# Patient Record
Sex: Female | Born: 1985 | Race: Black or African American | Hispanic: No | Marital: Single | Smoking: Never smoker
Health system: Southern US, Community
[De-identification: ages and names within clinical notes are randomized; demographics above are authoritative.]

## PROBLEM LIST (undated history)

## (undated) DIAGNOSIS — I10 Essential (primary) hypertension: Secondary | ICD-10-CM

---
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
## 2007-01-10 ENCOUNTER — Emergency Department: Payer: Self-pay | Admitting: Emergency Medicine

## 2007-08-08 ENCOUNTER — Emergency Department: Payer: Self-pay | Admitting: Emergency Medicine

## 2007-11-20 ENCOUNTER — Emergency Department: Payer: Self-pay | Admitting: Emergency Medicine

## 2008-04-23 ENCOUNTER — Emergency Department: Payer: Self-pay | Admitting: Emergency Medicine

## 2008-06-29 ENCOUNTER — Emergency Department: Payer: Self-pay | Admitting: Emergency Medicine

## 2008-08-18 ENCOUNTER — Emergency Department: Payer: Self-pay | Admitting: Emergency Medicine

## 2008-09-04 ENCOUNTER — Emergency Department: Payer: Self-pay | Admitting: Internal Medicine

## 2010-01-12 ENCOUNTER — Emergency Department: Payer: Self-pay | Admitting: Emergency Medicine

## 2011-06-07 ENCOUNTER — Emergency Department: Payer: Self-pay | Admitting: Emergency Medicine

## 2011-06-27 ENCOUNTER — Emergency Department: Payer: Self-pay | Admitting: *Deleted

## 2013-12-31 ENCOUNTER — Emergency Department: Payer: Self-pay | Admitting: Emergency Medicine

## 2014-01-03 LAB — BETA STREP CULTURE(ARMC)

## 2014-05-25 ENCOUNTER — Emergency Department: Payer: Self-pay | Admitting: Emergency Medicine

## 2014-05-25 LAB — URINALYSIS, COMPLETE
Bacteria: NONE SEEN
Bilirubin,UR: NEGATIVE
Blood: NEGATIVE
Glucose,UR: NEGATIVE mg/dL (ref 0–75)
Ketone: NEGATIVE
Nitrite: NEGATIVE
PH: 6 (ref 4.5–8.0)
Protein: NEGATIVE
RBC,UR: 2 /HPF (ref 0–5)
SPECIFIC GRAVITY: 1.02 (ref 1.003–1.030)
Squamous Epithelial: 1

## 2014-05-25 LAB — GC/CHLAMYDIA PROBE AMP

## 2014-05-25 LAB — WET PREP, GENITAL

## 2015-02-15 ENCOUNTER — Encounter (HOSPITAL_COMMUNITY): Payer: Self-pay | Admitting: Emergency Medicine

## 2015-02-15 ENCOUNTER — Emergency Department (HOSPITAL_COMMUNITY)
Admission: EM | Admit: 2015-02-15 | Discharge: 2015-02-15 | Disposition: A | Discharge status: Home / Self Care | Payer: Self-pay | Attending: Emergency Medicine | Admitting: Emergency Medicine

## 2015-02-15 ENCOUNTER — Emergency Department (HOSPITAL_COMMUNITY): Payer: Self-pay

## 2015-02-15 DIAGNOSIS — S81011A Laceration without foreign body, right knee, initial encounter: Secondary | ICD-10-CM | POA: Insufficient documentation

## 2015-02-15 DIAGNOSIS — S81012A Laceration without foreign body, left knee, initial encounter: Secondary | ICD-10-CM | POA: Insufficient documentation

## 2015-02-15 DIAGNOSIS — S79911A Unspecified injury of right hip, initial encounter: Secondary | ICD-10-CM | POA: Insufficient documentation

## 2015-02-15 DIAGNOSIS — Y9389 Activity, other specified: Secondary | ICD-10-CM | POA: Insufficient documentation

## 2015-02-15 DIAGNOSIS — Y998 Other external cause status: Secondary | ICD-10-CM | POA: Insufficient documentation

## 2015-02-15 DIAGNOSIS — S6991XA Unspecified injury of right wrist, hand and finger(s), initial encounter: Secondary | ICD-10-CM | POA: Insufficient documentation

## 2015-02-15 DIAGNOSIS — Z3202 Encounter for pregnancy test, result negative: Secondary | ICD-10-CM | POA: Insufficient documentation

## 2015-02-15 DIAGNOSIS — T148XXA Other injury of unspecified body region, initial encounter: Secondary | ICD-10-CM

## 2015-02-15 DIAGNOSIS — S59901A Unspecified injury of right elbow, initial encounter: Secondary | ICD-10-CM | POA: Insufficient documentation

## 2015-02-15 DIAGNOSIS — S6992XA Unspecified injury of left wrist, hand and finger(s), initial encounter: Secondary | ICD-10-CM | POA: Insufficient documentation

## 2015-02-15 DIAGNOSIS — Y92410 Unspecified street and highway as the place of occurrence of the external cause: Secondary | ICD-10-CM | POA: Insufficient documentation

## 2015-02-15 DIAGNOSIS — S30816A Abrasion of unspecified external genital organs, female, initial encounter: Secondary | ICD-10-CM | POA: Insufficient documentation

## 2015-02-15 LAB — I-STAT BETA HCG BLOOD, ED (MC, WL, AP ONLY)

## 2015-02-15 MED ORDER — FENTANYL CITRATE (PF) 100 MCG/2ML IJ SOLN
50.0000 ug | Freq: Once | INTRAMUSCULAR | Status: AC
  Administered 2015-02-15: 50 ug via INTRAVENOUS
  Filled 2015-02-15: qty 2

## 2015-02-15 MED ORDER — SILVER SULFADIAZINE 1 % EX CREA: 1.0000 "application " | TOPICAL_CREAM | Freq: Every day | CUTANEOUS | Status: DC

## 2015-02-15 MED ORDER — SILVER SULFADIAZINE 1 % EX CREA
TOPICAL_CREAM | Freq: Once | CUTANEOUS | Status: AC
  Administered 2015-02-15: 22:00:00 via TOPICAL
  Filled 2015-02-15: qty 85

## 2015-02-15 MED ORDER — ONDANSETRON HCL 4 MG/2ML IJ SOLN
4.0000 mg | Freq: Once | INTRAMUSCULAR | Status: AC
  Administered 2015-02-15: 4 mg via INTRAVENOUS
  Filled 2015-02-15: qty 2

## 2015-02-15 MED ORDER — HYDROCODONE-ACETAMINOPHEN 5-325 MG PO TABS: 1.0000 | ORAL_TABLET | Freq: Four times a day (QID) | ORAL | Status: DC | PRN

## 2015-02-15 MED ORDER — HYDROMORPHONE HCL 1 MG/ML IJ SOLN
0.5000 mg | Freq: Once | INTRAMUSCULAR | Status: AC
  Administered 2015-02-15: 0.5 mg via INTRAVENOUS
  Filled 2015-02-15: qty 1

## 2015-02-15 NOTE — Discharge Instructions (Signed)
Take ibuprofen for pain. Norco for severe pain only, do not drive if taking this medicine. Apply Silvadene cream to the abrasions, keep them wrapped with nonstick dressing. Change daily. Follow-up with the wound center for recheck or with primary care doctor.  Abrasion An abrasion is a cut or scrape of the skin. Abrasions do not extend through all layers of the skin and most heal within 10 days. It is important to care for your abrasion properly to prevent infection. CAUSES  Most abrasions are caused by falling on, or gliding across, the ground or other surface. When your skin rubs on something, the outer and inner layer of skin rubs off, causing an abrasion. DIAGNOSIS  Your caregiver will be able to diagnose an abrasion during a physical exam.  TREATMENT  Your treatment depends on how large and deep the abrasion is. Generally, your abrasion will be cleaned with water and a mild soap to remove any dirt or debris. An antibiotic ointment may be put over the abrasion to prevent an infection. A bandage (dressing) may be wrapped around the abrasion to keep it from getting dirty.  You may need a tetanus shot if:  You cannot remember when you had your last tetanus shot.  You have never had a tetanus shot.  The injury broke your skin. If you get a tetanus shot, your arm may swell, get red, and feel warm to the touch. This is common and not a problem. If you need a tetanus shot and you choose not to have one, there is a rare chance of getting tetanus. Sickness from tetanus can be serious.  HOME CARE INSTRUCTIONS   If a dressing was applied, change it at least once a day or as directed by your caregiver. If the bandage sticks, soak it off with warm water.   Wash the area with water and a mild soap to remove all the ointment 2 times a day. Rinse off the soap and pat the area dry with a clean towel.   Reapply any ointment as directed by your caregiver. This will help prevent infection and keep the  bandage from sticking. Use gauze over the wound and under the dressing to help keep the bandage from sticking.   Change your dressing right away if it becomes wet or dirty.   Only take over-the-counter or prescription medicines for pain, discomfort, or fever as directed by your caregiver.   Follow up with your caregiver within 24-48 hours for a wound check, or as directed. If you were not given a wound-check appointment, look closely at your abrasion for redness, swelling, or pus. These are signs of infection. SEEK IMMEDIATE MEDICAL CARE IF:   You have increasing pain in the wound.   You have redness, swelling, or tenderness around the wound.   You have pus coming from the wound.   You have a fever or persistent symptoms for more than 2-3 days.  You have a fever and your symptoms suddenly get worse.  You have a bad smell coming from the wound or dressing.  MAKE SURE YOU:   Understand these instructions.  Will watch your condition.  Will get help right away if you are not doing well or get worse. Document Released: 06/02/2005 Document Revised: 08/09/2012 Document Reviewed: 07/27/2011 [facility] Patient Information 2015 [place], [place]. This information is not intended to replace advice given to you by your health care provider. Make sure you discuss any questions you have with your health care provider.
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
## 2015-02-15 NOTE — ED Notes (Signed)
Patient transported to X-ray 
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
## 2016-11-26 ENCOUNTER — Ambulatory Visit (INDEPENDENT_AMBULATORY_CARE_PROVIDER_SITE_OTHER): Payer: Self-pay | Admitting: Obstetrics and Gynecology

## 2016-11-26 ENCOUNTER — Encounter: Payer: Self-pay | Admitting: Obstetrics and Gynecology

## 2016-11-26 VITALS — BP 114/70 | Ht 59.0 in | Wt 122.0 lb

## 2016-11-26 DIAGNOSIS — N979 Female infertility, unspecified: Secondary | ICD-10-CM | POA: Insufficient documentation

## 2016-11-26 NOTE — Progress Notes (Signed)
Gynecology H&P  PCP: No PCP Per Patient   Chief Complaint  Patient presents with  . Infertility   History of Present Illness: Patient is a 31 y.o. G1P0010 presenting for evaluation of infertility. Patient and partner have been attempting conception for 2 years. Marital Status: engaged for 3 years. Pregnancies with current partner no Has had a pelvic ultrasound that patient states was normal.    Workup at [facility] was negative: Labs performed and negative were as follows: Gonorrhea, chlamydia, trichomonas, CBC, quant hCG, Hgb electrophoresis, HIV, prolactin, TSH, Varicella, Rubella Blood type: A pos Pap smear: NILM  Sexual History Frequency: a few times per month(s) Satisfied: yes Dyspareunia: no Use of Lubricant: no Douching: no  Ovulatory Evaluation LMP: Patient's last menstrual period was 10/19/2016. Menarche:13 Duration of flow: 4 days Heavy Menses: no Clots: no Molimina yes Ovulation Predictor Kits Positive  yes Intermenstrual Bleeding: occasional spotting Dysmenorrhea: no Amenorrhea: not applicable Wt Change: no Hirsutism: no Balding: no Acne: no Galactorrhea: occasional (once every 6 months) Hot Flashes: no Meds: none Other Therapies: Not applicable  Tubal Factor Previous abdominal or pelvic surgery: none Pelvic Pain:  no Endometriosis: no STD: yes, trichomonas, treated at time of EAB (past [place] statutes, went to [place] to have induced AB) PID: no Laparoscopy: no Prior HSG: no  Female Factor Sired prior conception:  no Semen analysis: no  Contraception Combined OCPs, Depo Provera  Contributing Habits Cigarettes:    Patient -  former, quit 3 months ago    Fiance (Brandon) - yes, 1 packs per week for 12 years Alcohol:    Patient-  occasional mixed drinks    Husband - occasional mixed drinks Marijuana: Patient quit using 4 months (3 time per weeks), Apolinar Junes - daily  Review of Systems: Review of Systems  Constitutional: Negative.   HENT: Negative.     Eyes: Negative.   Respiratory: Negative.   Cardiovascular: Negative.   Gastrointestinal: Negative.   Genitourinary: Negative.   Musculoskeletal: Negative.   Skin: Negative.   Neurological: Negative.   Psychiatric/Behavioral: Negative.     Past Medical History:  History reviewed. No pertinent past medical history.   Past Surgical History:  Procedure Laterality Date  . INDUCED ABORTION  2006    Obstetric History: G2P0110  Induced abortion at >20 weeks at age 57 SAB possibly February this year. Was given misoprostol.  She had no menses for > 2 months and never had a positive pregnancy test and had negative blood work.   Family History:  Thyroid Problems: no Heart Condition or High Blood Pressure: high blood pressure (mother, MGM, MGF) Blood Clot or Stroke: MGF (stroke) Diabetes: MGGM Cancer: no Birth Defects/Inherited diseases:no Other Medical Problems: no MR/autism/fragile X or POF: no   Social History   Social History  . Marital status: Single    Spouse name: N/A  . Number of children: N/A  . Years of education: N/A   Occupational History  . Not on file.   Social History Main Topics  . Smoking status: Never Smoker  . Smokeless tobacco: Never Used  . Alcohol use Yes  . Drug use: No  . Sexual activity: Yes    Birth control/ protection: None   Other Topics Concern  . Not on file   Social History Narrative  . No narrative on file    Allergies: No Known Allergies  Medications: None    Physical Exam Vitals: Blood pressure 114/70, height 4\' 11"  (1.499 m), weight 122 lb (55.3 kg), last menstrual  period 10/19/2016. Physical Exam  Constitutional: She is oriented to person, place, and time. She appears well-developed and well-nourished. No distress.  Genitourinary: Vagina normal and uterus normal. Pelvic exam was performed with patient supine. There is no rash, tenderness, lesion or injury on the right labia. There is no rash, tenderness, lesion or injury  on the left labia. Right adnexum does not display mass, does not display tenderness and does not display fullness. Left adnexum does not display mass, does not display tenderness and does not display fullness. Cervix does not exhibit motion tenderness, lesion, discharge or polyp.   Uterus is mobile and anteverted. Uterus is not enlarged, tender or exhibiting a mass.  Genitourinary Comments: Uterus deviated to patient's left  HENT:  Head: Normocephalic and atraumatic.  Eyes: Conjunctivae and EOM are normal. No scleral icterus.  Neck: Normal range of motion. Neck supple. No tracheal deviation present. No thyromegaly present.  Cardiovascular: Normal rate and regular rhythm.  Exam reveals no gallop and no friction rub.   No murmur heard. Pulmonary/Chest: Effort normal and breath sounds normal. No respiratory distress. She has no wheezes. She has no rales.  Abdominal: Soft. Bowel sounds are normal. She exhibits no distension and no mass. There is no tenderness. There is no guarding. No hernia.  Musculoskeletal: Normal range of motion. She exhibits no edema or deformity.  Lymphadenopathy:    She has no cervical adenopathy.  Neurological: She is alert and oriented to person, place, and time. No cranial nerve deficit.  Skin: Skin is warm and dry. No rash noted.  Psychiatric: She has a normal mood and affect. Her behavior is normal. Judgment normal.    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   Plan: 1) We discussed the underlying etiologies which may be implicated in a couple experiencing difficulty conceiving.  The average couple will conceive within the span of 1 year with unprotected coitus, with a monthly fecundity rate of 20% or 1 in 5.  Even without further work up or intervention the patient and her partner may be successful in conceiving unassisted, although if an underlying etiology can be identified and addressed fecundity rate may improve.  The work up entails examining for ovulatory  function, tubal patency, and ruling out female factor infertility.  These may be looked at concurrently or sequentially.  The downside of sequential work up is that this method may miss issues if more than one compartment is contributing.  She is aware that tubal factor or moderate to severe female factor infertility will require further consultation with a reproductive endocrinologist.  In the case of anovulation, use of Clomid (clomiphen citrate) or Femara (letrazole) were discussed with the understanding the the later is an off-label, but well supported use.  With either of these drugs the risk of multiples increases from the standard population rate of 2% to approximately 10%, with higher order multiples possible but unlikely.  Both drugs may require some time to titrate to the appropriate dosage to ensure consistent ovulation.  Cycles will be limited to 6 cycles on each drug secondary to decreasing rates of conception after 6 cycles.  In addition should patient be started on ovulation induction with Clomid she was advised to discontinue the drug for any vision changes as this is a rare but potentially permanent side-effect if medication is continued.  We discussed timing of intercourse as well as the use of ovulation predictor kits identify the patient's fertile window each month.     2) Preconception counseling: she is  instructed to start taking PNVs and folic acid. Immunization up to date.  The patient denies any family history of conditions which would warrant preconception genetic counseling or testing on her or her partner.  Instructed to start prenatal vitamins while trying to conceive.     3) Will begin with day -21 progesterone level, which would be 4/2, based on LMP of 3/13.  Will schedule HSG once her next menses begins. Will also obtain a semen analysis for her fiance.    30 minutes spent in face to face discussion with > 50% spent in counseling and management of her infertility.   Thomasene MohairStephen  Jackson, MD 11/26/2016 1:36 PM

## 2016-12-06 ENCOUNTER — Other Ambulatory Visit: Payer: Medicaid Other

## 2016-12-10 ENCOUNTER — Other Ambulatory Visit: Payer: Medicaid Other

## 2016-12-10 ENCOUNTER — Ambulatory Visit: Payer: Medicaid Other | Admitting: Obstetrics and Gynecology

## 2016-12-13 ENCOUNTER — Other Ambulatory Visit: Payer: Medicaid Other

## 2017-01-05 ENCOUNTER — Other Ambulatory Visit: Payer: Medicaid Other

## 2017-01-05 ENCOUNTER — Ambulatory Visit: Payer: Medicaid Other | Admitting: Obstetrics and Gynecology

## 2018-10-29 ENCOUNTER — Emergency Department
Admission: EM | Admit: 2018-10-29 | Discharge: 2018-10-29 | Disposition: A | Discharge status: Home / Self Care | Payer: Self-pay | Attending: Emergency Medicine | Admitting: Emergency Medicine

## 2018-10-29 DIAGNOSIS — H6982 Other specified disorders of Eustachian tube, left ear: Secondary | ICD-10-CM | POA: Insufficient documentation

## 2018-10-29 MED ORDER — FLUTICASONE PROPIONATE 50 MCG/ACT NA SUSP: 2.0000 | Freq: Every day | NASAL | 0 refills | Status: DC

## 2018-10-29 MED ORDER — MECLIZINE HCL 25 MG PO TABS
12.5000 mg | ORAL_TABLET | Freq: Once | ORAL | Status: AC
  Administered 2018-10-29: 12.5 mg via ORAL
  Filled 2018-10-29: qty 1

## 2018-10-29 MED ORDER — MECLIZINE HCL 12.5 MG PO TABS: 12.5000 mg | ORAL_TABLET | Freq: Three times a day (TID) | ORAL | 0 refills | Status: DC | PRN

## 2018-10-29 NOTE — ED Provider Notes (Signed)
[facility] Emergency Department Provider Note ____________________________________________  Time seen: Approximately 11:13 PM  I have reviewed the triage vital signs and the nursing notes.   HISTORY  Chief Complaint Otalgia    HPI Carly Rodriguez is a 33 y.o. female resents to the emergency department for treatment and evaluation of left ear pain and decreased ability to hear.  She had pain in ear for a couple of days then today noticed that she could not hear very well.  She states that she thought maybe it was wax and used some earwax drops.  She states that wind makes the pain worse.  She has felt a little bit of off balance, but states that she has a history of vertigo.   History reviewed. No pertinent past medical history.  Patient Active Problem List   Diagnosis Date Noted  . Infertility, female 11/26/2016    Past Surgical History:  Procedure Laterality Date  . INDUCED ABORTION  2006    Prior to Admission medications   Not on File    Allergies Patient has no known allergies.  No family history on file.  Social History Social History   Tobacco Use  . Smoking status: Never Smoker  . Smokeless tobacco: Never Used  Substance Use Topics  . Alcohol use: Yes  . Drug use: No    Review of Systems Constitutional: Negative for fever. Positive for decreased ability to hear from left ear(s). Eyes: Negative for discharge or drainage. ENT:       Positive for otalgia in left ear(s).      Negative for rhinorrhea or congestion.      Negative for sore throat. Gastrointestinal: Negative for nausea, vomiting, or diarrhea. Musculoskeletal: Negative for myalgias. Skin: Negative for rash, lesions, or wounds. Neurological: Negative for paresthesias. ____________________________________________   PHYSICAL EXAM:  VITAL SIGNS: ED Triage Vitals  Enc Vitals Group     BP 10/29/18 2207 (!) 136/98     Pulse Rate 10/29/18 2207 90     Resp 10/29/18  2207 16     Temp 10/29/18 2207 98.5 F (36.9 C)     Temp Source 10/29/18 2207 Oral     SpO2 10/29/18 2207 100 %     Weight 10/29/18 2207 120 lb (54.4 kg)     Height 10/29/18 2207 4\' 11"  (1.499 m)     Head Circumference --      Peak Flow --      Pain Score 10/29/18 2211 8     Pain Loc --      Pain Edu? --      Excl. in GC? --     Constitutional: Well appearing. Eyes: Conjunctivae are clear without discharge or drainage. Ears:       Right TM: Normal.      Left TM: Retracted with air-fluid levels. Head: Atraumatic. Nose: No rhinorrhea or sinus pain on percussion. Mouth/Throat: Oropharynx normal. Tonsils  without exudate. Hematological/Lymphatic/Immunilogical: No palpable anterior cervical lymphadenopathy. Cardiovascular: Heart rate and rhythm are regular without murmur, gallop, or rub appreciated. Respiratory: Breath sounds are clear throughout to auscultation.  Neurologic:  Alert and oriented x 4. Skin: Intact and without rash, lesion, or wound on exposed skin surfaces. ____________________________________________   LABS (all labs ordered are listed, but only abnormal results are displayed)  Labs Reviewed - No data to display ____________________________________________   RADIOLOGY  Not indicated ____________________________________________   PROCEDURES  Procedure(s) performed:   Procedures  ____________________________________________   INITIAL IMPRESSION / ASSESSMENT AND  PLAN / ED COURSE  33 year old female presents to the emergency department for decreased ability to hear out of the left ear as well as otalgia.  She states that she had felt like she had an ear infection for a few days, but today noticed that the pain in the ear had gotten worse and she had decreased ability to hear.  She has not noticed any drainage or discharge from the ear.  Exam shows a retracted eardrum with air-fluid levels.  She states that she had missed a couple doses of her Zyrtec, but  has since restarted taking that daily.  She complains that her vertigo has gotten worse with the pain in her ear.  She will be treated with Antivert and fluticasone nasal spray.  She was encouraged to continue taking her antihistamine every day.  She is to follow-up with the ear nose and throat specialist for symptoms are not improving over the week.  She was encouraged to return to the emergency department for symptoms of change or worsen if she is unable to schedule appointment.  Pertinent labs & imaging results that were available during my care of the patient were reviewed by me and considered in my medical decision making (see chart for details). ____________________________________________   FINAL CLINICAL IMPRESSION(S) / ED DIAGNOSES  Final diagnoses:  None    ED Discharge Orders    None      If controlled substance prescribed during this visit, 12 month history viewed on the [facility] prior to issuing an initial prescription for Schedule II or III opiod.   Note:  This document was prepared using Dragon voice recognition software and may include unintentional dictation errors.     Chinita Pester, FNP 10/29/18 2329    Sharman Cheek, MD 11/03/18 [phone]

## 2018-10-29 NOTE — Discharge Instructions (Signed)
Follow-up with the ear nose and throat specialist for symptoms that are not improving with these medications.  Continue taking your daily allergy medication.  Return to the emergency department for symptoms that change or worsen if you are unable to schedule an appointment with your primary care provider or the specialist.

## 2018-10-29 NOTE — ED Triage Notes (Signed)
Patient presents with possible left ear infection. Was sore for a day or two. Today had a feeling of not hearing too well out of it, feeling of being off balance, and slight swelling under the ear itself.

## 2019-08-13 ENCOUNTER — Other Ambulatory Visit: Payer: Self-pay

## 2019-08-13 DIAGNOSIS — Z20822 Contact with and (suspected) exposure to covid-19: Secondary | ICD-10-CM

## 2019-08-15 ENCOUNTER — Ambulatory Visit: Payer: Self-pay

## 2019-08-15 LAB — NOVEL CORONAVIRUS, NAA: SARS-CoV-2, NAA: DETECTED — AB

## 2019-08-15 NOTE — Telephone Encounter (Signed)
Provided-19 test results .  Voiced  Understanding.    Denies  Any Sx.  Encouraged Patient to call back with questions,  Voiced understanding.Reviewed isolation Protocol

## 2020-01-18 ENCOUNTER — Emergency Department
Admission: EM | Admit: 2020-01-18 | Discharge: 2020-01-18 | Disposition: A | Discharge status: Home / Self Care | Payer: Self-pay | Attending: Emergency Medicine | Admitting: Emergency Medicine

## 2020-01-18 ENCOUNTER — Other Ambulatory Visit: Payer: Self-pay

## 2020-01-18 ENCOUNTER — Encounter: Payer: Self-pay | Admitting: Emergency Medicine

## 2020-01-18 DIAGNOSIS — N12 Tubulo-interstitial nephritis, not specified as acute or chronic: Secondary | ICD-10-CM

## 2020-01-18 DIAGNOSIS — N3000 Acute cystitis without hematuria: Secondary | ICD-10-CM

## 2020-01-18 DIAGNOSIS — Z79899 Other long term (current) drug therapy: Secondary | ICD-10-CM | POA: Insufficient documentation

## 2020-01-18 DIAGNOSIS — R350 Frequency of micturition: Secondary | ICD-10-CM | POA: Insufficient documentation

## 2020-01-18 DIAGNOSIS — R5383 Other fatigue: Secondary | ICD-10-CM | POA: Insufficient documentation

## 2020-01-18 DIAGNOSIS — R3915 Urgency of urination: Secondary | ICD-10-CM | POA: Insufficient documentation

## 2020-01-18 DIAGNOSIS — R509 Fever, unspecified: Secondary | ICD-10-CM | POA: Insufficient documentation

## 2020-01-18 LAB — CBC WITH DIFFERENTIAL/PLATELET
Abs Immature Granulocytes: 0.05 10*3/uL (ref 0.00–0.07)
Basophils Absolute: 0.1 10*3/uL (ref 0.0–0.1)
Basophils Relative: 0 %
Eosinophils Absolute: 0.2 10*3/uL (ref 0.0–0.5)
Eosinophils Relative: 1 %
HCT: 36.4 % (ref 36.0–46.0)
Hemoglobin: 12.4 g/dL (ref 12.0–15.0)
Immature Granulocytes: 0 %
Lymphocytes Relative: 15 %
Lymphs Abs: 2.1 10*3/uL (ref 0.7–4.0)
MCH: 31.2 pg (ref 26.0–34.0)
MCHC: 34.1 g/dL (ref 30.0–36.0)
MCV: 91.7 fL (ref 80.0–100.0)
Monocytes Absolute: 1.3 10*3/uL — ABNORMAL HIGH (ref 0.1–1.0)
Monocytes Relative: 9 %
Neutro Abs: 10.2 10*3/uL — ABNORMAL HIGH (ref 1.7–7.7)
Neutrophils Relative %: 75 %
Platelets: 280 10*3/uL (ref 150–400)
RBC: 3.97 MIL/uL (ref 3.87–5.11)
RDW: 12.9 % (ref 11.5–15.5)
WBC: 13.9 10*3/uL — ABNORMAL HIGH (ref 4.0–10.5)
nRBC: 0 % (ref 0.0–0.2)

## 2020-01-18 LAB — URINALYSIS, COMPLETE (UACMP) WITH MICROSCOPIC
Bilirubin Urine: NEGATIVE
Glucose, UA: NEGATIVE mg/dL
Hgb urine dipstick: NEGATIVE
Ketones, ur: 20 mg/dL — AB
Nitrite: POSITIVE — AB
Protein, ur: NEGATIVE mg/dL
Specific Gravity, Urine: 1.015 (ref 1.005–1.030)
WBC, UA: >50 WBC/hpf — ABNORMAL HIGH (ref 0–5)
pH: 6 (ref 5.0–8.0)

## 2020-01-18 LAB — COMPREHENSIVE METABOLIC PANEL
ALT: 15 U/L (ref 0–44)
AST: 15 U/L (ref 15–41)
Albumin: 3.6 g/dL (ref 3.5–5.0)
Alkaline Phosphatase: 47 U/L (ref 38–126)
Anion gap: 8 (ref 5–15)
BUN: 6 mg/dL (ref 6–20)
CO2: 25 mmol/L (ref 22–32)
Calcium: 8.6 mg/dL — ABNORMAL LOW (ref 8.9–10.3)
Chloride: 103 mmol/L (ref 98–111)
Creatinine, Ser: 0.75 mg/dL (ref 0.44–1.00)
GFR calc Af Amer: >60 mL/min (ref >60)
GFR calc non Af Amer: >60 mL/min (ref >60)
Glucose, Bld: 117 mg/dL — ABNORMAL HIGH (ref 70–99)
Potassium: 3.7 mmol/L (ref 3.5–5.1)
Sodium: 136 mmol/L (ref 135–145)
Total Bilirubin: 0.8 mg/dL (ref 0.3–1.2)
Total Protein: 7.6 g/dL (ref 6.5–8.1)

## 2020-01-18 LAB — LACTIC ACID, PLASMA: Lactic Acid, Venous: 0.8 mmol/L (ref 0.5–1.9)

## 2020-01-18 LAB — POCT PREGNANCY, URINE: Preg Test, Ur: NEGATIVE

## 2020-01-18 MED ORDER — SODIUM CHLORIDE 0.9% FLUSH: 3.0000 mL | Freq: Once | INTRAVENOUS | Status: DC

## 2020-01-18 MED ORDER — CIPROFLOXACIN HCL 500 MG PO TABS: 500.0000 mg | ORAL_TABLET | Freq: Two times a day (BID) | ORAL | 0 refills | Status: DC

## 2020-01-18 MED ORDER — ONDANSETRON 4 MG PO TBDP: 4.0000 mg | ORAL_TABLET | Freq: Three times a day (TID) | ORAL | 0 refills | Status: DC | PRN

## 2020-01-18 NOTE — ED Triage Notes (Signed)
Pt presents to ED via POV with c/o L flank pain that started on Monday, pt states started drinking cranberry juice, had intermittent fevers, took a covid test that was negative. Pt states strong urine smell at home. Pt states took Tylenol last night at approx 2300.

## 2020-01-18 NOTE — ED Provider Notes (Signed)
[facility] Emergency Department Provider Note  ____________________________________________  Time seen: Approximately 9:54 AM  I have reviewed the triage vital signs and the nursing notes.   HISTORY  Chief Complaint Dysuria    HPI Carly Rodriguez is a 34 y.o. female with no significant past medical history who comes ED complaining of fatigue, urinary frequency and urgency for the past month, now worsened with left flank pain fevers chills and strong smelling urine odor for the past 4 days.  Symptoms are waxing and waning, no aggravating or alleviating factors.  No vomiting chest pain shortness of breath.  Flank pain is nonradiating.      History reviewed. No pertinent past medical history.   Patient Active Problem List   Diagnosis Date Noted  . Infertility, female 11/26/2016     Past Surgical History:  Procedure Laterality Date  . INDUCED ABORTION  2006     Prior to Admission medications   Medication Sig Start Date End Date Taking? Authorizing Provider  ciprofloxacin (CIPRO) 500 MG tablet Take 1 tablet (500 mg total) by mouth 2 (two) times daily. 01/18/20   Carrie Mew, MD  fluticasone [facility]) 50 MCG/ACT nasal spray Place 2 sprays into both nostrils daily. 10/29/18   Triplett, Johnette Abraham B, FNP  meclizine (ANTIVERT) 12.5 MG tablet Take 1 tablet (12.5 mg total) by mouth 3 (three) times daily as needed for dizziness. 10/29/18   Triplett, Johnette Abraham B, FNP  ondansetron (ZOFRAN ODT) 4 MG disintegrating tablet Take 1 tablet (4 mg total) by mouth every 8 (eight) hours as needed for nausea or vomiting. 01/18/20   Carrie Mew, MD     Allergies Patient has no known allergies.   History reviewed. No pertinent family history.  Social History Social History   Tobacco Use  . Smoking status: Never Smoker  . Smokeless tobacco: Never Used  Substance Use Topics  . Alcohol use: Yes  . Drug use: No    Review of Systems  Constitutional: Positive  fever and chills.  ENT:   No sore throat. No rhinorrhea. Cardiovascular:   No chest pain or syncope. Respiratory:   No dyspnea or cough. Gastrointestinal:   Negative for abdominal pain, vomiting and diarrhea.  Musculoskeletal:     Positive flank pain All other systems reviewed and are negative except as documented above in ROS and HPI.  ____________________________________________   PHYSICAL EXAM:  VITAL SIGNS: ED Triage Vitals [01/18/20 0821]  Enc Vitals Group     BP (!) 152/91     Pulse Rate (!) 104     Resp 18     Temp 100 F (37.8 C)     Temp Source Oral     SpO2 100 %     Weight 115 lb (52.2 kg)     Height 4\' 11"  (1.499 m)     Head Circumference      Peak Flow      Pain Score 8     Pain Loc      Pain Edu?      Excl. in [place]?     Vital signs reviewed, nursing assessments reviewed.   Constitutional:   Alert and oriented. Non-toxic appearance. Eyes:   Conjunctivae are normal. EOMI. PERRL. ENT      Head:   Normocephalic and atraumatic.      Nose:   Wearing a mask.      Mouth/Throat:   Wearing a mask.      Neck:   No meningismus. Full ROM.  Cardiovascular:   RRR. Symmetric bilateral radial and DP pulses.  No murmurs. Cap refill less than 2 seconds. Respiratory:   Normal respiratory effort without tachypnea/retractions. Breath sounds are clear and equal bilaterally. No wheezes/rales/rhonchi. Gastrointestinal:   Soft with left lower quadrant tenderness. Non distended. There is mild left-sided CVA tenderness.  No rebound, rigidity, or guarding.  Musculoskeletal:   Normal range of motion in all extremities. No joint effusions.  No lower extremity tenderness.  No edema. Neurologic:   Normal speech and language.  Motor grossly intact. No acute focal neurologic deficits are appreciated.  Skin:    Skin is warm, dry and intact. No rash noted.  No petechiae, purpura, or bullae.  ____________________________________________    LABS (pertinent positives/negatives) (all  labs ordered are listed, but only abnormal results are displayed) Labs Reviewed  COMPREHENSIVE METABOLIC PANEL - Abnormal; Notable for the following components:      Result Value   Glucose, Bld 117 (*)    Calcium 8.6 (*)    All other components within normal limits  CBC WITH DIFFERENTIAL/PLATELET - Abnormal; Notable for the following components:   WBC 13.9 (*)    Neutro Abs 10.2 (*)    Monocytes Absolute 1.3 (*)    All other components within normal limits  URINALYSIS, COMPLETE (UACMP) WITH MICROSCOPIC - Abnormal; Notable for the following components:   Color, Urine YELLOW (*)    APPearance CLOUDY (*)    Ketones, ur 20 (*)    Nitrite POSITIVE (*)    Leukocytes,Ua SMALL (*)    WBC, UA >50 (*)    Bacteria, UA MANY (*)    All other components within normal limits  LACTIC ACID, PLASMA  POC URINE PREG, ED  POCT PREGNANCY, URINE   ____________________________________________   EKG    ____________________________________________    RADIOLOGY  No results found.  ____________________________________________   PROCEDURES Procedures  ____________________________________________    CLINICAL IMPRESSION / ASSESSMENT AND PLAN / ED COURSE  Medications ordered in the ED: Medications  sodium chloride flush (NS) 0.9 % injection 3 mL (3 mLs Intravenous Not Given 01/18/20 5284)    Pertinent labs & imaging results that were available during my care of the patient were reviewed by me and considered in my medical decision making (see chart for details).  Carly Rodriguez was evaluated in Emergency Department on 01/18/2020 for the symptoms described in the history of present illness. She was evaluated in the context of the global COVID-19 pandemic, which necessitated consideration that the patient might be at risk for infection with the SARS-CoV-2 virus that causes COVID-19. Institutional protocols and algorithms that pertain to the evaluation of patients at risk for COVID-19 are in  a state of rapid change based on information released by regulatory bodies including the CDC and federal and state organizations. These policies and algorithms were followed during the patient's care in the ED.   Patient presents with urinary symptoms and constitutional symptoms.  Exam is consistent with cystitis and pyelonephritis.  She has slight tachycardia, but she is nontoxic and not septic on assessment. Considering the patient's symptoms, medical history, and physical examination today, I have low suspicion for cholecystitis or biliary pathology, pancreatitis, perforation or bowel obstruction, hernia, intra-abdominal abscess, AAA or dissection, volvulus or intussusception, mesenteric ischemia, or appendicitis.  Doubt STI PID TOA or torsion.  Labs are reassuring other than urinalysis which is consistent with UTI.  Also the urine culture, start her on ciprofloxacin, Zofran as needed.  ____________________________________________   FINAL CLINICAL IMPRESSION(S) / ED DIAGNOSES    Final diagnoses:  Pyelonephritis of left kidney  Acute cystitis without hematuria     ED Discharge Orders         Ordered    ondansetron (ZOFRAN ODT) 4 MG disintegrating tablet  Every 8 hours PRN     01/18/20 0954    ciprofloxacin (CIPRO) 500 MG tablet  2 times daily     01/18/20 0954          Portions of this note were generated with dragon dictation software. Dictation errors may occur despite best attempts at proofreading.   Sharman Cheek, MD 01/18/20 [phone]

## 2020-01-20 LAB — URINE CULTURE: Culture: >=100000 — AB

## 2020-02-25 ENCOUNTER — Other Ambulatory Visit: Payer: Self-pay

## 2020-02-25 ENCOUNTER — Emergency Department
Admission: EM | Admit: 2020-02-25 | Discharge: 2020-02-25 | Disposition: A | Discharge status: Home / Self Care | Payer: HRSA Program | Attending: Emergency Medicine | Admitting: Emergency Medicine

## 2020-02-25 DIAGNOSIS — Z20822 Contact with and (suspected) exposure to covid-19: Secondary | ICD-10-CM | POA: Insufficient documentation

## 2020-02-25 DIAGNOSIS — J00 Acute nasopharyngitis [common cold]: Secondary | ICD-10-CM | POA: Insufficient documentation

## 2020-02-25 DIAGNOSIS — R0981 Nasal congestion: Secondary | ICD-10-CM | POA: Diagnosis present

## 2020-02-25 LAB — SARS CORONAVIRUS 2 BY RT PCR (HOSPITAL ORDER, PERFORMED IN ~~LOC~~ HOSPITAL LAB): SARS Coronavirus 2: NEGATIVE

## 2020-02-25 NOTE — ED Provider Notes (Signed)
[facility] Emergency Department Provider Note  ____________________________________________   First MD Initiated Contact with Patient 02/25/20 1016     (approximate)  I have reviewed the triage vital signs and the nursing notes.   HISTORY  Chief Complaint URI    HPI Carly Rodriguez is a 34 y.o. female presents to the emergency department complaining of URI symptoms.  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  She denies any fever or chills.  Patient previously had Covid in December 2020.  She did not receive a vaccine.  She denies any chest pain or shortness of breath.  States she feels like is just her sinuses.    History reviewed. No pertinent past medical history.  Patient Active Problem List   Diagnosis Date Noted  . Infertility, female 11/26/2016    Past Surgical History:  Procedure Laterality Date  . INDUCED ABORTION  2006    Prior to Admission medications   Not on File    Allergies Patient has no known allergies.  History reviewed. No pertinent family history.  Social History Social History   Tobacco Use  . Smoking status: Never Smoker  . Smokeless tobacco: Never Used  Substance Use Topics  . Alcohol use: Yes  . Drug use: No    Review of Systems  Constitutional: No fever/chills Eyes: No visual changes. ENT: Positive for nasal congestion and sore throat. Respiratory: Denies cough Cardiovascular: Denies chest pain Gastrointestinal: Denies abdominal pain Genitourinary: Negative for dysuria. Musculoskeletal: Negative for back pain. Skin: Negative for rash. Psychiatric: no mood changes,     ____________________________________________   PHYSICAL EXAM:  VITAL SIGNS: ED Triage Vitals  Enc Vitals Group     BP 02/25/20 1000 (!) 132/105     Pulse Rate 02/25/20 1000 (!) 102     Resp 02/25/20 1000 15     Temp 02/25/20 1000 99.2 F (37.3 C)     Temp Source 02/25/20 1000 Oral      SpO2 02/25/20 1000 100 %     Weight 02/25/20 1001 117 lb (53.1 kg)     Height 02/25/20 1001 4\' 11"  (1.499 m)     Head Circumference --      Peak Flow --      Pain Score 02/25/20 1001 6     Pain Loc --      Pain Edu? --      Excl. in [place]? --     Constitutional: Alert and oriented. Well appearing and in no acute distress. Eyes: Conjunctivae are normal.  Head: Atraumatic. Nose: Positive congestion/rhinnorhea. Mouth/Throat: Mucous membranes are moist.  Throat appears normal, no swollen tonsils or exudate are noted Neck:  supple no lymphadenopathy noted Cardiovascular: Normal rate, regular rhythm. Heart sounds are normal Respiratory: Normal respiratory effort.  No retractions, lungs c t a  GU: deferred Musculoskeletal: FROM all extremities, warm and well perfused Neurologic:  Normal speech and language.  Skin:  Skin is warm, dry and intact. No rash noted. Psychiatric: Mood and affect are normal. Speech and behavior are normal.  ____________________________________________   LABS (all labs ordered are listed, but only abnormal results are displayed)  Labs Reviewed  SARS CORONAVIRUS 2 BY RT PCR (HOSPITAL ORDER, PERFORMED IN [facility])   ____________________________________________   ____________________________________________  RADIOLOGY    ____________________________________________   PROCEDURES  Procedure(s) performed: No  Procedures    ____________________________________________   INITIAL IMPRESSION / ASSESSMENT AND PLAN / ED  COURSE  Pertinent labs & imaging results that were available during my care of the patient were reviewed by me and considered in my medical decision making (see chart for details).   Patient is 34 year old female presents emergency department with concerns of URI symptoms.  See HPI.  Physical exam is consistent with a common cold.  However due to the patient working in daycare we will do Covid test.  She was given a  work note stating that she has a pending Covid test and could return if the test is negative.  If positive she will need to quarantine for 10 to 14 days.  She is to use over-the-counter cold medicines.  Mucinex, allergy medications.  Return emergency department worsening.  She is discharged stable condition.  ----------------------------------------- 3:08 PM on 02/25/2020 -----------------------------------------  I did try to inform the patient of her negative test result.  She did not answer her phone and did not return my call.    Carly Rodriguez was evaluated in Emergency Department on 02/25/2020 for the symptoms described in the history of present illness. She was evaluated in the context of the global COVID-19 pandemic, which necessitated consideration that the patient might be at risk for infection with the SARS-CoV-2 virus that causes COVID-19. Institutional protocols and algorithms that pertain to the evaluation of patients at risk for COVID-19 are in a state of rapid change based on information released by regulatory bodies including the CDC and federal and state organizations. These policies and algorithms were followed during the patient's care in the ED.   As part of my medical decision making, I reviewed the following data within the [place] notes reviewed and incorporated, Labs reviewed , Old chart reviewed, Notes from prior ED visits and [place] Controlled Substance Database  ____________________________________________   FINAL CLINICAL IMPRESSION(S) / ED DIAGNOSES  Final diagnoses:  Common cold  Suspected COVID-19 virus infection      NEW MEDICATIONS STARTED DURING THIS VISIT:  New Prescriptions   No medications on file     Note:  This document was prepared using Dragon voice recognition software and may include unintentional dictation errors.    Versie Starks, PA-C 02/25/20 1508    Vanessa Oakhurst, MD 02/26/20 [phone]

## 2020-02-25 NOTE — Discharge Instructions (Signed)
Your Covid test should result in 2 to 3 hours.  We will call you with the result. For your symptoms take over-the-counter Mucinex, over-the-counter allergy medicine, Tylenol or ibuprofen if needed for pain/fever. Return to the emergency department if worsening

## 2020-02-25 NOTE — ED Notes (Signed)
See triage note  Presents with nasal congestion and sore throat  sxs' started yesterday  States she usually takes Zyrtec for her allergies    Took it later yesterday  Was outside  States sxs' became worse during the night  Low grade temp on arrival

## 2020-02-25 NOTE — ED Triage Notes (Signed)
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

## 2020-03-19 ENCOUNTER — Emergency Department
Admission: EM | Admit: 2020-03-19 | Discharge: 2020-03-19 | Disposition: A | Discharge status: Home / Self Care | Payer: Medicaid Other | Attending: Emergency Medicine | Admitting: Emergency Medicine

## 2020-03-19 ENCOUNTER — Encounter: Payer: Self-pay | Admitting: Emergency Medicine

## 2020-03-19 ENCOUNTER — Other Ambulatory Visit: Payer: Self-pay

## 2020-03-19 DIAGNOSIS — H1032 Unspecified acute conjunctivitis, left eye: Secondary | ICD-10-CM | POA: Insufficient documentation

## 2020-03-19 MED ORDER — SULFACETAMIDE SODIUM 10 % OP SOLN: 2.0000 [drp] | Freq: Four times a day (QID) | OPHTHALMIC | 0 refills | Status: AC

## 2020-03-19 NOTE — ED Notes (Signed)
See triage note  Presents with left eye irritation   States her eye has been watery

## 2020-03-19 NOTE — ED Triage Notes (Signed)
Pt reports left eye is itchy, runny and was crusted this am.

## 2020-03-19 NOTE — ED Provider Notes (Signed)
[facility] Emergency Department Provider Note ____________________________________________  Time seen: Approximately 11:26 AM  I have reviewed the triage vital signs and the nursing notes.   HISTORY  Chief Complaint Eye Problem   HPI Carly Rodriguez is a 34 y.o. female presents to the emergency department for treatment and evaluation of left eye irritation, erythema, and drainage.  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  No alleviating measures attempted prior to arrival.  No change in vision.   History reviewed. No pertinent past medical history.  Patient Active Problem List   Diagnosis Date Noted   Infertility, female 11/26/2016    Past Surgical History:  Procedure Laterality Date   INDUCED ABORTION  2006    Prior to Admission medications   Medication Sig Start Date End Date Taking? Authorizing Provider  sulfacetamide (BLEPH-10) 10 % ophthalmic solution Place 2 drops into the right eye 4 (four) times daily for 10 days. 03/19/20 03/29/20  Chinita Pester, FNP    Allergies Patient has no known allergies.  No family history on file.  Social History Social History   Tobacco Use   Smoking status: Never Smoker   Smokeless tobacco: Never Used  Substance Use Topics   Alcohol use: Yes   Drug use: No    Review of Systems   Constitutional: No fever/chills Eyes: Negative for visual changes.  Negative for pain.  Positive for drainage. Musculoskeletal: Negative for pain. Skin: Negative for rash. Neurological: Negative for headaches, focal weakness or numbness. Allergic: Negative for seasonal allergies. ____________________________________________  PHYSICAL EXAM:  VITAL SIGNS: ED Triage Vitals  Enc Vitals Group     BP 03/19/20 1033 (!) 153/97     Pulse Rate 03/19/20 1033 92     Resp 03/19/20  1033 18     Temp 03/19/20 1033 98.2 F (36.8 C)     Temp Source 03/19/20 1033 Oral     SpO2 03/19/20 1033 97 %     Weight 03/19/20 1030 117 lb (53.1 kg)     Height 03/19/20 1030 4\' 11"  (1.499 m)     Head Circumference --      Peak Flow --      Pain Score 03/19/20 1030 0     Pain Loc --      Pain Edu? --      Excl. in [place]? --     Constitutional: Alert and oriented. Well appearing and in no acute distress. Eyes: Visual acuity--see nursing documentation; no globe trauma; Eyelids normal to inspection; Sclera appears anicteric.  Eyelids not inverted. Conjunctiva appears erythematous; Cornea normal appearance on unstained exam. Head: Atraumatic. Nose: No congestion/rhinnorhea. Mouth/Throat: Mucous membranes are moist.  Oropharynx non-erythematous. Respiratory: Respirations even and unlabored. Breath sounds clear to auscultation. Musculoskeletal:Normal ROM x 4 extremities. Neurologic:  Normal speech and language. No gross focal neurologic deficits are appreciated. Speech is normal. No gait instability. Skin:  Skin is warm, dry and intact. No rash noted. Psychiatric: Mood and affect are normal. Speech and behavior are normal.  ____________________________________________   LABS (all labs ordered are listed, but only abnormal results are displayed)  Labs Reviewed - No data to display ____________________________________________  EKG  Not indicated ____________________________________________  RADIOLOGY  Not indicated ____________________________________________   PROCEDURES  Procedure(s) performed: None ____________________________________________   INITIAL IMPRESSION / ASSESSMENT AND PLAN / ED COURSE  34 year old female presenting to the emergency department for symptoms and exam consistent with conjunctivitis.  She will be placed on left 10 ophthalmic solution and given a work excuse for the next 24 hours.  She was encouraged to follow-up with ophthalmology if her  symptoms are not improving over the next few days.  She is to return to the emergency department for symptoms of change or worsen if she is unable to schedule an appointment.  Pertinent labs & imaging results that were available during my care of the patient were reviewed by me and considered in my medical decision making (see chart for details). ____________________________________________   FINAL CLINICAL IMPRESSION(S) / ED DIAGNOSES  Final diagnoses:  Acute bacterial conjunctivitis of left eye    Note:  This document was prepared using Dragon voice recognition software and may include unintentional dictation errors.    Chinita Pester, FNP 03/20/20 1515    Emily Filbert, MD 03/20/20 1517

## 2020-07-01 ENCOUNTER — Emergency Department
Admission: EM | Admit: 2020-07-01 | Discharge: 2020-07-01 | Disposition: A | Discharge status: Home / Self Care | Payer: 59 | Attending: Emergency Medicine | Admitting: Emergency Medicine

## 2020-07-01 ENCOUNTER — Emergency Department: Payer: 59

## 2020-07-01 ENCOUNTER — Other Ambulatory Visit: Payer: Self-pay

## 2020-07-01 ENCOUNTER — Encounter: Payer: Self-pay | Admitting: Emergency Medicine

## 2020-07-01 DIAGNOSIS — R059 Cough, unspecified: Secondary | ICD-10-CM | POA: Diagnosis present

## 2020-07-01 DIAGNOSIS — Z20822 Contact with and (suspected) exposure to covid-19: Secondary | ICD-10-CM | POA: Insufficient documentation

## 2020-07-01 DIAGNOSIS — J069 Acute upper respiratory infection, unspecified: Secondary | ICD-10-CM | POA: Insufficient documentation

## 2020-07-01 LAB — RESPIRATORY PANEL BY RT PCR (FLU A&B, COVID)
Influenza A by PCR: NEGATIVE
Influenza B by PCR: NEGATIVE
SARS Coronavirus 2 by RT PCR: NEGATIVE

## 2020-07-01 MED ORDER — AMOXICILLIN 875 MG PO TABS: 875.0000 mg | ORAL_TABLET | Freq: Two times a day (BID) | ORAL | 0 refills | Status: DC

## 2020-07-01 NOTE — ED Triage Notes (Signed)
Patient to ER for +cough and nasal congestion/discharge since yesterday. Patient reports yellow sputum both with cough and yellow discharge from nose. Possible fever this am.

## 2020-07-01 NOTE — ED Provider Notes (Signed)
[facility] Emergency Department Provider Note  ____________________________________________   First MD Initiated Contact with Patient 07/01/20 1327     (approximate)  I have reviewed the triage vital signs and the nursing notes.   HISTORY  Chief Complaint Cough and Nasal Congestion    HPI Carly Rodriguez is a 34 y.o. female presents emergency department, and cough and congestion with low-grade fever last night.  Patient states she had some yellow sputum with the cough and some yellow nasal drainage.  She denies chest pain or shortness of breath.  Patient had Covid in 2020.  Unsure of any exposures.    History reviewed. No pertinent past medical history.  Patient Active Problem List   Diagnosis Date Noted  . Infertility, female 11/26/2016    Past Surgical History:  Procedure Laterality Date  . INDUCED ABORTION  2006    Prior to Admission medications   Medication Sig Start Date End Date Taking? Authorizing Provider  amoxicillin (AMOXIL) 875 MG tablet Take 1 tablet (875 mg total) by mouth 2 (two) times daily. 07/01/20   Faythe Ghee, PA-C    Allergies Patient has no known allergies.  No family history on file.  Social History Social History   Tobacco Use  . Smoking status: Never Smoker  . Smokeless tobacco: Never Used  Substance Use Topics  . Alcohol use: Yes  . Drug use: No    Review of Systems  Constitutional: Positive fever/chills Eyes: No visual changes. ENT: Positive sore throat. Respiratory: Positive cough Cardiovascular: Denies chest pain Gastrointestinal: Denies abdominal pain Genitourinary: Negative for dysuria. Musculoskeletal: Negative for back pain. Skin: Negative for rash. Psychiatric: no mood changes,     ____________________________________________   PHYSICAL EXAM:  VITAL SIGNS: ED Triage Vitals  Enc Vitals Group     BP 07/01/20 1218 (!) 141/109     Pulse Rate 07/01/20 1218 99     Resp  07/01/20 1218 18     Temp 07/01/20 1218 98.2 F (36.8 C)     Temp Source 07/01/20 1218 Oral     SpO2 07/01/20 1218 99 %     Weight 07/01/20 1212 121 lb (54.9 kg)     Height 07/01/20 1212 4\' 11"  (1.499 m)     Head Circumference --      Peak Flow --      Pain Score 07/01/20 1212 0     Pain Loc --      Pain Edu? --      Excl. in GC? --     Constitutional: Alert and oriented. Well appearing and in no acute distress. Eyes: Conjunctivae are normal.  Head: Atraumatic. Nose: Positive congestion/rhinnorhea. Mouth/Throat: Mucous membranes are moist.  Throat is injected posteriorly Neck:  supple no lymphadenopathy noted Cardiovascular: Normal rate, regular rhythm. Heart sounds are normal Respiratory: Normal respiratory effort.  No retractions, lungs c t a  GU: deferred Musculoskeletal: FROM all extremities, warm and well perfused Neurologic:  Normal speech and language.  Skin:  Skin is warm, dry and intact. No rash noted. Psychiatric: Mood and affect are normal. Speech and behavior are normal.  ____________________________________________   LABS (all labs ordered are listed, but only abnormal results are displayed)  Labs Reviewed  RESPIRATORY PANEL BY RT PCR (FLU A&B, COVID)   ____________________________________________   ____________________________________________  RADIOLOGY  Chest x-ray  ____________________________________________   PROCEDURES  Procedure(s) performed: No  Procedures    ____________________________________________   INITIAL IMPRESSION / ASSESSMENT AND PLAN / ED COURSE  Pertinent labs & imaging results that were available during my care of the patient were reviewed by me and considered in my medical decision making (see chart for details).   Patient is a 34 year old female presents for complaints of cough and congestion.  See HPI.  Physical exam shows patient appears stable.  Exam shows nasal congestion and clear lungs.  Patient will be  tested for Covid and chest x-ray performed   Covid test and chest x-ray which is reviewed by me are normal.  I did explain findings to the patient.  Due to the green mucus I did put her on amoxicillin.  She is to follow-up with her regular doctor if not improving to 3 days.  Return emergency department worsening.  She was given a work note and a copy of her Covid test discharged stable condition.  Carly Rodriguez was evaluated in Emergency Department on 07/01/2020 for the symptoms described in the history of present illness. She was evaluated in the context of the global COVID-19 pandemic, which necessitated consideration that the patient might be at risk for infection with the SARS-CoV-2 virus that causes COVID-19. Institutional protocols and algorithms that pertain to the evaluation of patients at risk for COVID-19 are in a state of rapid change based on information released by regulatory bodies including the CDC and federal and state organizations. These policies and algorithms were followed during the patient's care in the ED.    As part of my medical decision making, I reviewed the following data within the electronic MEDICAL RECORD NUMBER Nursing notes reviewed and incorporated, Labs reviewed , Old chart reviewed, Radiograph reviewed , Notes from prior ED visits and [place] Controlled Substance Database  ____________________________________________   FINAL CLINICAL IMPRESSION(S) / ED DIAGNOSES  Final diagnoses:  Acute upper respiratory infection      NEW MEDICATIONS STARTED DURING THIS VISIT:  Discharge Medication List as of 07/01/2020  2:46 PM       Note:  This document was prepared using Dragon voice recognition software and may include unintentional dictation errors.    Faythe Ghee, PA-C 07/01/20 1544    Dionne Bucy, MD 07/03/20 [phone]

## 2020-07-01 NOTE — Discharge Instructions (Addendum)
Follow-up with your regular doctor if not improving in 2 to 3 days.  Return emergency department worsening.  Your Covid test is negative.  Chest x-ray was normal.  Use medication as prescribed.  Also use over-the-counter allergy medicine and cough medicine.

## 2020-07-30 ENCOUNTER — Ambulatory Visit: Payer: 59 | Admitting: Advanced Practice Midwife

## 2020-09-12 ENCOUNTER — Encounter: Payer: Self-pay | Admitting: Emergency Medicine

## 2020-09-12 ENCOUNTER — Other Ambulatory Visit: Payer: Self-pay

## 2020-09-12 ENCOUNTER — Emergency Department
Admission: EM | Admit: 2020-09-12 | Discharge: 2020-09-12 | Disposition: A | Discharge status: Home / Self Care | Payer: 59 | Attending: Emergency Medicine | Admitting: Emergency Medicine

## 2020-09-12 DIAGNOSIS — R059 Cough, unspecified: Secondary | ICD-10-CM

## 2020-09-12 DIAGNOSIS — U071 COVID-19: Secondary | ICD-10-CM

## 2020-09-12 MED ORDER — BENZONATATE 100 MG PO CAPS: 100.0000 mg | ORAL_CAPSULE | Freq: Three times a day (TID) | ORAL | 0 refills | Status: AC | PRN

## 2020-09-12 NOTE — ED Provider Notes (Signed)
[facility] Emergency Department Provider Note  ____________________________________________   Event Date/Time   First MD Initiated Contact with Patient 09/12/20 1526     (approximate)  I have reviewed the triage vital signs and the nursing notes.   HISTORY  Chief Complaint Cough    HPI Carly Rodriguez is a 35 y.o. female otherwise healthy comes in with cough.  Patient was diagnosed with COVID 2 weeks ago continues to have a cough that is moderate, constant, worse at night, better at rest.  Mild amount of sputum.  She is otherwise denying any fevers, chest pain or shortness of breath.  Denies risk factors for PE          History reviewed. No pertinent past medical history.  Patient Active Problem List   Diagnosis Date Noted  . Infertility, female 11/26/2016    Past Surgical History:  Procedure Laterality Date  . INDUCED ABORTION  2006    Prior to Admission medications   Medication Sig Start Date End Date Taking? Authorizing Provider  amoxicillin (AMOXIL) 875 MG tablet Take 1 tablet (875 mg total) by mouth 2 (two) times daily. 07/01/20   Faythe Ghee, PA-C    Allergies Patient has no known allergies.  No family history on file.  Social History Social History   Tobacco Use  . Smoking status: Never Smoker  . Smokeless tobacco: Never Used  Substance Use Topics  . Alcohol use: Yes  . Drug use: No      Review of Systems Constitutional: No fever/chills Eyes: No visual changes. ENT: No sore throat. Cardiovascular: Denies chest pain. Respiratory: Denies shortness of breath.  Positive cough Gastrointestinal: No abdominal pain.  No nausea, no vomiting.  No diarrhea.  No constipation. Genitourinary: Negative for dysuria. Musculoskeletal: Negative for back pain. Skin: Negative for rash. Neurological: Negative for headaches, focal weakness or numbness. All other ROS  negative ____________________________________________   PHYSICAL EXAM:  VITAL SIGNS: ED Triage Vitals  Enc Vitals Group     BP 09/12/20 1311 (!) 134/112     Pulse Rate 09/12/20 1311 83     Resp 09/12/20 1311 16     Temp 09/12/20 1311 98.1 F (36.7 C)     Temp Source 09/12/20 1403 Oral     SpO2 09/12/20 1311 100 %     Weight 09/12/20 1256 121 lb 0.5 oz (54.9 kg)     Height 09/12/20 1256 4\' 11"  (1.499 m)     Head Circumference --      Peak Flow --      Pain Score 09/12/20 1256 0     Pain Loc --      Pain Edu? --      Excl. in GC? --     Constitutional: Alert and oriented. Well appearing and in no acute distress. Eyes: Conjunctivae are normal. EOMI. Head: Atraumatic. Nose: No congestion/rhinnorhea. Mouth/Throat: Mucous membranes are moist.   Neck: No stridor. Trachea Midline. FROM Cardiovascular: Normal rate, regular rhythm. Grossly normal heart sounds.  Good peripheral circulation. Respiratory: Normal respiratory effort.  No retractions. Lungs CTAB. Gastrointestinal: Soft and nontender. No distention. No abdominal bruits.  Musculoskeletal: No lower extremity tenderness nor edema.  No joint effusions. Neurologic:  Normal speech and language. No gross focal neurologic deficits are appreciated.  Skin:  Skin is warm, dry and intact. No rash noted. Psychiatric: Mood and affect are normal. Speech and behavior are normal. GU: Deferred   ____________________________________________    INITIAL IMPRESSION / ASSESSMENT AND  PLAN / ED COURSE  Carly Rodriguez was evaluated in Emergency Department on 09/12/2020 for the symptoms described in the history of present illness. She was evaluated in the context of the global COVID-19 pandemic, which necessitated consideration that the patient might be at risk for infection with the SARS-CoV-2 virus that causes COVID-19. Institutional protocols and algorithms that pertain to the evaluation of patients at risk for COVID-19 are in a state of  rapid change based on information released by regulatory bodies including the CDC and federal and state organizations. These policies and algorithms were followed during the patient's care in the ED.    Patient is very well-appearing 35 year old who comes in with cough post COVID.  Patient's lungs sound clear to auscultation and she satting at 100% on room air.  Low suspicion for COVID complication for bacterial pneumonia.  No evidence of pneumothorax with listening to her.  Will suspicion for PE.  PERC negative discussed symptomatic treatment  I discussed the provisional nature of ED diagnosis, the treatment so far, the ongoing plan of care, follow up appointments and return precautions with the patient and any family or support people present. They expressed understanding and agreed with the plan, discharged home.    ____________________________________________   FINAL CLINICAL IMPRESSION(S) / ED DIAGNOSES   Final diagnoses:  Cough  COVID-19      MEDICATIONS GIVEN DURING THIS VISIT:  Medications - No data to display   ED Discharge Orders         Ordered    benzonatate (TESSALON PERLES) 100 MG capsule  3 times daily PRN        09/12/20 1538           Note:  This document was prepared using Dragon voice recognition software and may include unintentional dictation errors.   Concha Se, MD 09/12/20 [phone]

## 2020-09-12 NOTE — ED Triage Notes (Signed)
C?O cough.   States tested positive for COVID 2 weeks ago and cough persists.  AAOx3.  Skin warm and dry. No SOB/ DOE NAD

## 2020-09-12 NOTE — Discharge Instructions (Addendum)
Take the Tessalon Perles to help with cough.  Take Flonase over-the-counter to help with postnasal drip.  If you continue to have a significant cough you should not return to work however if your cough is resolving you should be able to return to work on Monday.  Have your blood pressure rechecked in 2 weeks to make sure that is not remaining elevated.

## 2021-09-08 IMAGING — DX DG CHEST 1V PORT
1 series · 1 of 1 positions shown · non-contrast
Comparison: None.

CLINICAL DATA: Cough and congestion

EXAM:
PORTABLE CHEST 1 VIEW

[chest ap]
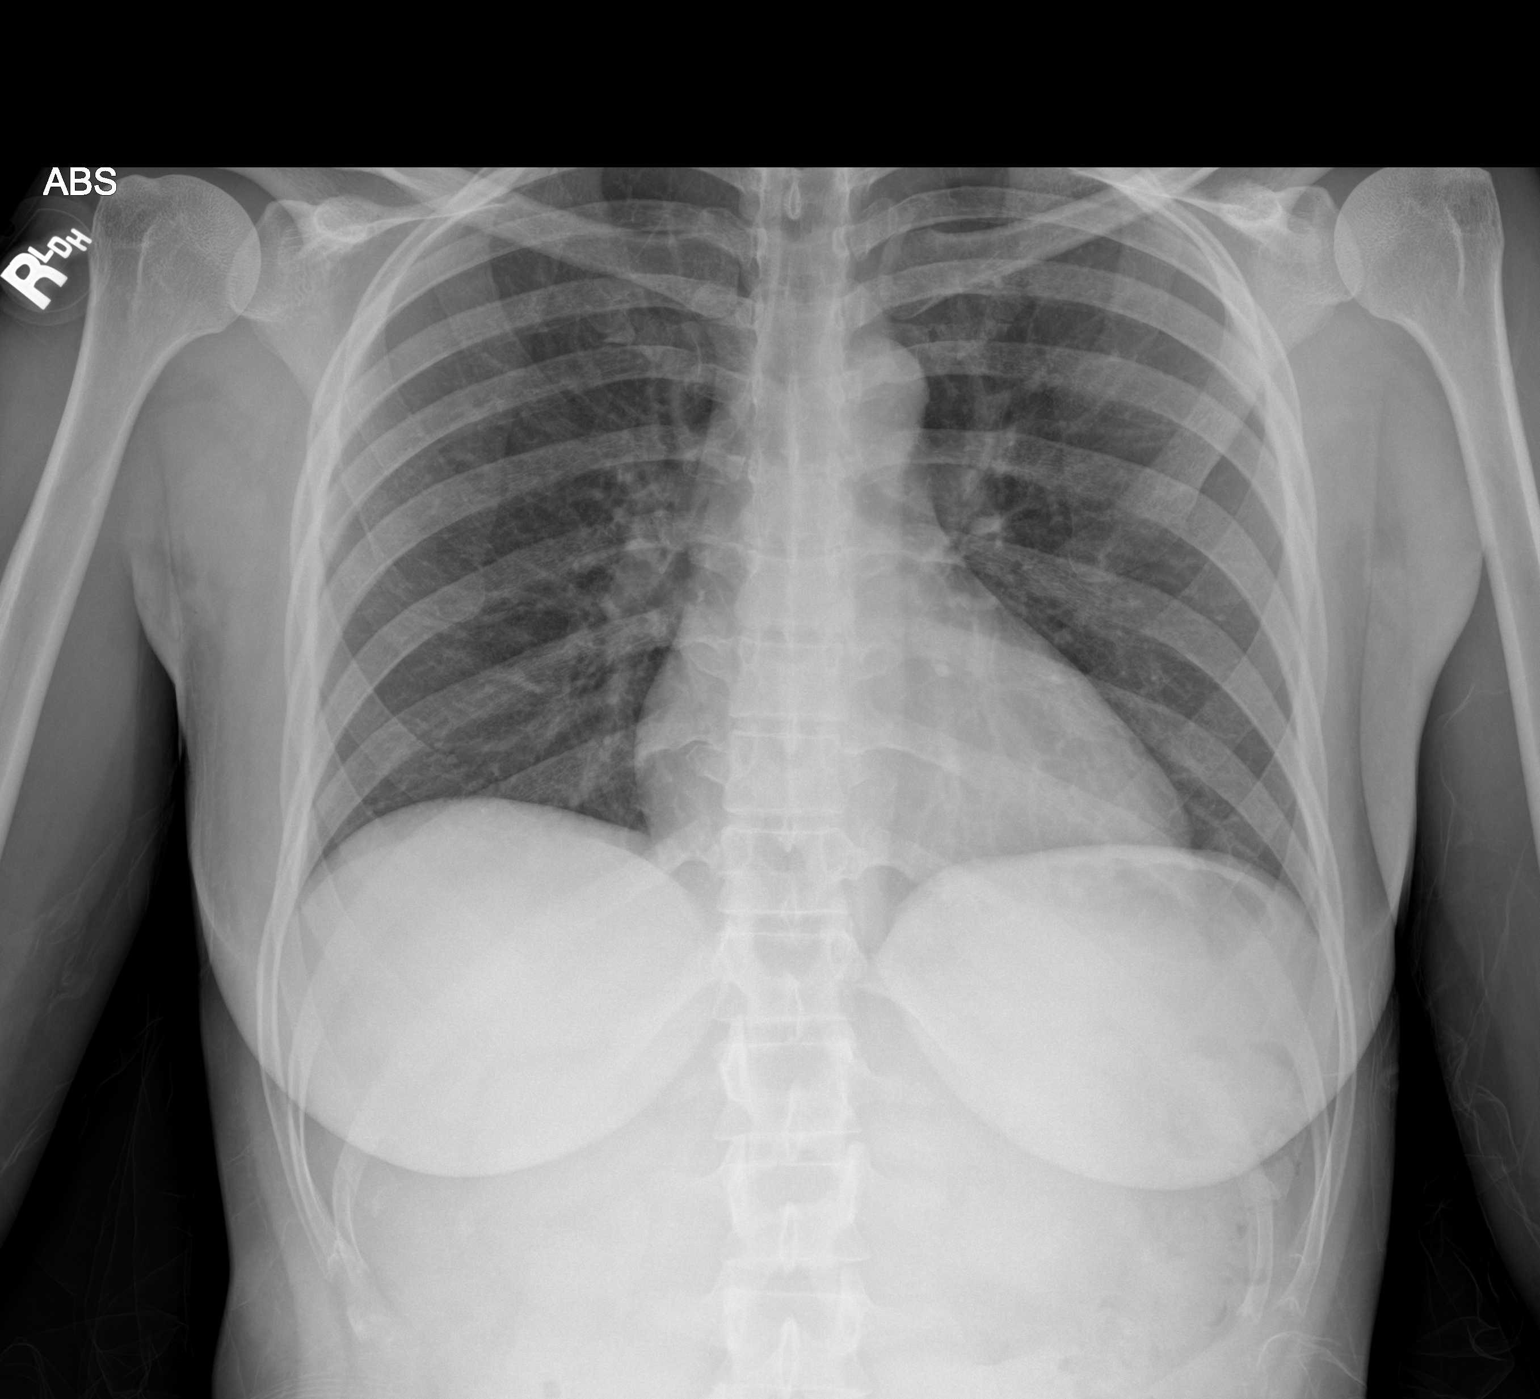

[1 of 1 positions shown; findings below may reference images not displayed]

FINDINGS: The lungs are clear. The heart size and pulmonary vascularity are
normal. No adenopathy. No bone lesions.
IMPRESSION: Lungs clear.  Cardiac silhouette normal.

## 2022-01-08 ENCOUNTER — Emergency Department
Admission: EM | Admit: 2022-01-08 | Discharge: 2022-01-08 | Disposition: A | Discharge status: Home / Self Care | Payer: Self-pay | Attending: Emergency Medicine | Admitting: Emergency Medicine

## 2022-01-08 ENCOUNTER — Encounter: Payer: Self-pay | Admitting: Emergency Medicine

## 2022-01-08 ENCOUNTER — Other Ambulatory Visit: Payer: Self-pay

## 2022-01-08 DIAGNOSIS — H10211 Acute toxic conjunctivitis, right eye: Secondary | ICD-10-CM | POA: Insufficient documentation

## 2022-01-08 DIAGNOSIS — Y990 Civilian activity done for income or pay: Secondary | ICD-10-CM | POA: Insufficient documentation

## 2022-01-08 DIAGNOSIS — T5994XA Toxic effect of unspecified gases, fumes and vapors, undetermined, initial encounter: Secondary | ICD-10-CM | POA: Insufficient documentation

## 2022-01-08 DIAGNOSIS — S0591XA Unspecified injury of right eye and orbit, initial encounter: Secondary | ICD-10-CM | POA: Insufficient documentation

## 2022-01-08 DIAGNOSIS — X58XXXA Exposure to other specified factors, initial encounter: Secondary | ICD-10-CM | POA: Insufficient documentation

## 2022-01-08 MED ORDER — TETRACAINE HCL 0.5 % OP SOLN
2.0000 [drp] | Freq: Once | OPHTHALMIC | Status: AC
  Administered 2022-01-08: 2 [drp] via OPHTHALMIC
  Filled 2022-01-08: qty 4

## 2022-01-08 MED ORDER — ERYTHROMYCIN 5 MG/GM OP OINT: 1.0000 "application " | TOPICAL_OINTMENT | Freq: Four times a day (QID) | OPHTHALMIC | 0 refills | Status: DC

## 2022-01-08 MED ORDER — FLUORESCEIN SODIUM 1 MG OP STRP
1.0000 | ORAL_STRIP | Freq: Once | OPHTHALMIC | Status: AC
Start: 2022-01-08 — End: 2022-01-08
  Administered 2022-01-08: 1 via OPHTHALMIC
  Filled 2022-01-08: qty 1

## 2022-01-08 NOTE — ED Provider Notes (Signed)
? ?[facility] ?Provider Note ? ? ? Event Date/Time  ? First MD Initiated Contact with Patient 01/08/22 1120   ?  (approximate) ? ? ?History  ? ?Chief Complaint ?Eye Injury ? ? ?HPI ? ?Carly Rodriguez is a 36 y.o. female with no significant past medical history presents to the ED complaining of eye injury.  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  She feels like some of the bleach missed got into her right eye and she had immediate onset of burning and irritation.  She states it was initially difficult for her to keep the eye open, however I was irrigated immediately in triage upon arrival.  She now feels like the eye is doing better with her vision back to normal and minimal ongoing irritation.  She does not wear glasses or contacts. ?  ? ? ?Physical Exam  ? ?Triage Vital Signs: ?ED Triage Vitals  ?Enc Vitals Group  ?   BP 01/08/22 1107 (!) 133/104  ?   Pulse Rate 01/08/22 1107 88  ?   Resp 01/08/22 1107 16  ?   Temp 01/08/22 1109 98.2 ?F (36.8 ?C)  ?   Temp src --   ?   SpO2 01/08/22 1107 99 %  ?   Weight 01/08/22 1107 120 lb (54.4 kg)  ?   Height 01/08/22 1107 4\' 11"  (1.499 m)  ?   Head Circumference --   ?   Peak Flow --   ?   Pain Score 01/08/22 1107 4  ?   Pain Loc --   ?   Pain Edu? --   ?   Excl. in GC? --   ? ? ?Most recent vital signs: ?Vitals:  ? 01/08/22 1107 01/08/22 1109  ?BP: (!) 133/104   ?Pulse: 88   ?Resp: 16   ?Temp:  98.2 ?F (36.8 ?C)  ?SpO2: 99%   ? ? ?Constitutional: Alert and oriented. ?Eyes: Pupils equal, round, and reactive to light bilaterally.  Extraocular movements intact.  Mild conjunctival irritation on right with no corneal haziness. ?Head: Atraumatic. ?Nose: No congestion/rhinnorhea. ?Mouth/Throat: Mucous membranes are moist.  ?Cardiovascular: Normal rate, regular rhythm. Grossly normal heart sounds.  2+ radial pulses bilaterally. ?Respiratory: Normal respiratory effort.  No retractions.  Lungs CTAB. ?Gastrointestinal: Soft and nontender. No distention. ?Musculoskeletal: No lower extremity tenderness nor edema.  ?Neurologic:  Normal speech and language. No gross focal neurologic deficits are appreciated. ? ? ? ?ED Results / Procedures / Treatments  ? ?Labs ?(all labs ordered are listed, but only abnormal results are displayed) ?Labs Reviewed - No data to display ? ? ?PROCEDURES: ? ?Critical Care performed: No ? ?Procedures ? ? ?MEDICATIONS ORDERED IN ED: ?Medications  ?tetracaine (PONTOCAINE) 0.5 % ophthalmic solution 2 drop (2 drops Both Eyes Given by Other 01/08/22 1136)  ?fluorescein ophthalmic strip 1 strip (1 strip Both Eyes Given 01/08/22 1136)  ? ? ? ?IMPRESSION / MDM / ASSESSMENT AND PLAN / ED COURSE  ?I reviewed the triage vital signs and the nursing notes. ?             ?               ? ?36 y.o. female with no significant past medical history presents to the ED with right eye irritation after coming into contact with bleach spray she was using for cleaning. ? ?Differential diagnosis includes, but is not limited to,  chemical conjunctivitis, alkali burn, corneal ulceration. ? ?Patient well-appearing and in no acute distress, vital signs are unremarkable and her symptoms seem to be significantly improved following eye irrigation.  Visual acuity is now 20/20 in both eyes and patient with minimal signs of irritation on exam.  pH has now normalized to between 7 and 8 following when checked with pH paper.  She is appropriate for discharge home with close ophthalmology follow-up, will be prescribed erythromycin eye ointment and was counseled to return to the ED for new worsening symptoms.  Patient agrees with plan. ? ?  ? ? ?FINAL CLINICAL IMPRESSION(S) / ED DIAGNOSES  ? ?Final diagnoses:  ?Chemical conjunctivitis of right eye  ? ? ? ?Rx / DC Orders  ? ?ED Discharge Orders   ? ?      Ordered  ?  erythromycin ophthalmic ointment  4 times daily       ? 01/08/22 1200  ? ?  ?  ? ?  ? ? ? ?Note:  This  document was prepared using Dragon voice recognition software and may include unintentional dictation errors. ?  ?Chesley Noon, MD ?01/08/22 1202 ? ?

## 2022-01-08 NOTE — ED Triage Notes (Signed)
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 Pt states that she did not flush ey prior to arrival. Eye was flushed at eye wash station for 15 minutes upon arrival to ED. Pt states that pain in her eye has decreased since flushing it.  ?

## 2022-01-08 NOTE — ED Notes (Signed)
Patients visual acuity for RIGHT and LEFT eye 20/20. ?

## 2022-01-08 NOTE — ED Notes (Signed)
See triage note   presents with right eye irritated   states she got bleach splashed in right eye  states she flushed her eye with water for 20 mins ? ?

## 2022-01-08 NOTE — ED Notes (Signed)
Pt states she will not file workers comp at this time. She will contact supervisor and follow up with them. Bonita Quin, RN made aware. ?

## 2023-01-18 DIAGNOSIS — Z319 Encounter for procreative management, unspecified: Secondary | ICD-10-CM | POA: Diagnosis not present

## 2023-02-02 DIAGNOSIS — Z319 Encounter for procreative management, unspecified: Secondary | ICD-10-CM | POA: Diagnosis not present

## 2023-03-21 ENCOUNTER — Encounter: Payer: Self-pay | Admitting: Emergency Medicine

## 2023-03-21 ENCOUNTER — Other Ambulatory Visit: Payer: Self-pay

## 2023-03-21 ENCOUNTER — Emergency Department
Admission: EM | Admit: 2023-03-21 | Discharge: 2023-03-21 | Disposition: A | Discharge status: Home / Self Care | Payer: 59 | Attending: Emergency Medicine | Admitting: Emergency Medicine

## 2023-03-21 DIAGNOSIS — J209 Acute bronchitis, unspecified: Secondary | ICD-10-CM | POA: Insufficient documentation

## 2023-03-21 DIAGNOSIS — R059 Cough, unspecified: Secondary | ICD-10-CM | POA: Diagnosis not present

## 2023-03-21 DIAGNOSIS — Z1152 Encounter for screening for COVID-19: Secondary | ICD-10-CM | POA: Diagnosis not present

## 2023-03-21 DIAGNOSIS — I10 Essential (primary) hypertension: Secondary | ICD-10-CM | POA: Insufficient documentation

## 2023-03-21 HISTORY — DX: Essential (primary) hypertension: I10

## 2023-03-21 LAB — SARS CORONAVIRUS 2 BY RT PCR: SARS Coronavirus 2 by RT PCR: NEGATIVE

## 2023-03-21 MED ORDER — ALBUTEROL SULFATE HFA 108 (90 BASE) MCG/ACT IN AERS: 2.0000 | INHALATION_SPRAY | Freq: Four times a day (QID) | RESPIRATORY_TRACT | 2 refills | Status: DC | PRN

## 2023-03-21 MED ORDER — PREDNISONE 10 MG (21) PO TBPK: ORAL_TABLET | ORAL | 0 refills | Status: AC

## 2023-03-21 MED ORDER — ALBUTEROL SULFATE HFA 108 (90 BASE) MCG/ACT IN AERS: 2.0000 | INHALATION_SPRAY | Freq: Four times a day (QID) | RESPIRATORY_TRACT | 2 refills | Status: AC | PRN

## 2023-03-21 MED ORDER — PREDNISONE 10 MG (21) PO TBPK: ORAL_TABLET | ORAL | 0 refills | Status: DC

## 2023-03-21 MED ORDER — IPRATROPIUM-ALBUTEROL 0.5-2.5 (3) MG/3ML IN SOLN
3.0000 mL | Freq: Once | RESPIRATORY_TRACT | Status: AC
  Administered 2023-03-21: 3 mL via RESPIRATORY_TRACT
  Filled 2023-03-21: qty 3

## 2023-03-21 NOTE — ED Triage Notes (Signed)
C/O cough, painful cough since yesterday. Also c/o SOB.  Sinus congestion.  Denies chills.  AAOx3.  Skin warm and dry. No SOB/ DOE. NAD

## 2023-03-21 NOTE — ED Provider Notes (Signed)
[facility] Provider Note    Event Date/Time   First MD Initiated Contact with Patient 03/21/23 1243     (approximate)   History   Cough   HPI  Carly Rodriguez is a 37 y.o. female with history of hypertension presents emergency department complaining of cough, some sinus drainage, and some shortness of breath.  Feels like her chest is very tight and she is wheezing.  Denies pain.  Patient is a smoker.  Is not on birth control pills.      Physical Exam   Triage Vital Signs: ED Triage Vitals  Encounter Vitals Group     BP 03/21/23 1204 (!) 178/127     Systolic BP Percentile --      Diastolic BP Percentile --      Pulse Rate 03/21/23 1204 92     Resp 03/21/23 1203 16     Temp 03/21/23 1203 99 F (37.2 C)     Temp Source 03/21/23 1203 Oral     SpO2 03/21/23 1204 97 %     Weight 03/21/23 1202 119 lb 14.9 oz (54.4 kg)     Height 03/21/23 1202 4\' 11"  (1.499 m)     Head Circumference --      Peak Flow --      Pain Score 03/21/23 1201 0     Pain Loc --      Pain Education --      Exclude from Growth Chart --     Most recent vital signs: Vitals:   03/21/23 1203 03/21/23 1204  BP:  (!) 178/127  Pulse:  92  Resp: 16   Temp: 99 F (37.2 C)   SpO2:  97%     General: Awake, no distress.   CV:  Good peripheral perfusion. regular rate and  rhythm Resp:  Normal effort. Lungs with wheezing anteriorly, some posteriorly Abd:  No distention.   Other:      ED Results / Procedures / Treatments   Labs (all labs ordered are listed, but only abnormal results are displayed) Labs Reviewed  SARS CORONAVIRUS 2 BY RT PCR     EKG     RADIOLOGY     PROCEDURES:   Procedures   MEDICATIONS ORDERED IN ED: Medications  ipratropium-albuterol (DUONEB) 0.5-2.5 (3) MG/3ML nebulizer solution 3 mL (3 mLs Nebulization Given 03/21/23 1303)     IMPRESSION / MDM / ASSESSMENT AND PLAN / ED COURSE  I reviewed the triage vital signs and the  nursing notes.                              Differential diagnosis includes, but is not limited to, acute bronchitis, asthma, COVID, URI, CAP  Patient's presentation is most consistent with acute presentation with potential threat to life or bodily function.   Due to the wheezing we will go ahead and give patient a DuoNeb while here in the ED  COVID test is reassuring  Patient was given a DuoNeb and did have relief with this medication.  Did explain all findings to the patient.  Placed her on a albuterol inhaler and steroid pack.  Do not feel that she needs a antibiotic.  Patient is to follow-up with her regular doctor if not improving to 3 days.  Return if worsening.  She is discharged in stable condition.  Was also given a work note      FINAL CLINICAL IMPRESSION(S) /  ED DIAGNOSES   Final diagnoses:  Acute bronchitis, unspecified organism     Rx / DC Orders   ED Discharge Orders          Ordered    predniSONE (STERAPRED UNI-PAK 21 TAB) 10 MG (21) TBPK tablet  Status:  Discontinued        03/21/23 1329    albuterol (VENTOLIN HFA) 108 (90 Base) MCG/ACT inhaler  Every 6 hours PRN,   Status:  Discontinued        03/21/23 1329    albuterol (VENTOLIN HFA) 108 (90 Base) MCG/ACT inhaler  Every 6 hours PRN        03/21/23 1448    predniSONE (STERAPRED UNI-PAK 21 TAB) 10 MG (21) TBPK tablet        03/21/23 1448             Note:  This document was prepared using Dragon voice recognition software and may include unintentional dictation errors.    Faythe Ghee, PA-C 03/21/23 1520    Jene Every, MD 03/21/23 [phone]

## 2023-05-19 DIAGNOSIS — Z1371 Encounter for nonprocreative screening for genetic disease carrier status: Secondary | ICD-10-CM | POA: Diagnosis not present

## 2023-05-19 DIAGNOSIS — D259 Leiomyoma of uterus, unspecified: Secondary | ICD-10-CM | POA: Diagnosis not present

## 2023-05-19 DIAGNOSIS — Z3141 Encounter for fertility testing: Secondary | ICD-10-CM | POA: Diagnosis not present

## 2023-05-19 DIAGNOSIS — Z3169 Encounter for other general counseling and advice on procreation: Secondary | ICD-10-CM | POA: Diagnosis not present

## 2023-05-26 DIAGNOSIS — Z1371 Encounter for nonprocreative screening for genetic disease carrier status: Secondary | ICD-10-CM | POA: Diagnosis not present

## 2023-05-27 DIAGNOSIS — Z1371 Encounter for nonprocreative screening for genetic disease carrier status: Secondary | ICD-10-CM | POA: Diagnosis not present

## 2023-08-21 DIAGNOSIS — J329 Chronic sinusitis, unspecified: Secondary | ICD-10-CM | POA: Diagnosis not present

## 2023-08-21 DIAGNOSIS — I10 Essential (primary) hypertension: Secondary | ICD-10-CM | POA: Diagnosis not present

## 2023-09-21 DIAGNOSIS — L0201 Cutaneous abscess of face: Secondary | ICD-10-CM | POA: Diagnosis not present

## 2023-09-21 DIAGNOSIS — I10 Essential (primary) hypertension: Secondary | ICD-10-CM | POA: Diagnosis not present

## 2023-09-26 ENCOUNTER — Other Ambulatory Visit: Payer: Self-pay

## 2023-09-26 ENCOUNTER — Emergency Department
Admission: EM | Admit: 2023-09-26 | Discharge: 2023-09-26 | Disposition: A | Discharge status: Home / Self Care | Payer: 59 | Attending: Emergency Medicine | Admitting: Emergency Medicine

## 2023-09-26 ENCOUNTER — Encounter: Payer: Self-pay | Admitting: Emergency Medicine

## 2023-09-26 DIAGNOSIS — I10 Essential (primary) hypertension: Secondary | ICD-10-CM | POA: Diagnosis not present

## 2023-09-26 DIAGNOSIS — R519 Headache, unspecified: Secondary | ICD-10-CM | POA: Diagnosis not present

## 2023-09-26 LAB — CBC
HCT: 40 % (ref 36.0–46.0)
Hemoglobin: 12.7 g/dL (ref 12.0–15.0)
MCH: 28.9 pg (ref 26.0–34.0)
MCHC: 31.8 g/dL (ref 30.0–36.0)
MCV: 91.1 fL (ref 80.0–100.0)
Platelets: 412 10*3/uL — ABNORMAL HIGH (ref 150–400)
RBC: 4.39 MIL/uL (ref 3.87–5.11)
RDW: 13.9 % (ref 11.5–15.5)
WBC: 8.6 10*3/uL (ref 4.0–10.5)
nRBC: 0 % (ref 0.0–0.2)

## 2023-09-26 LAB — COMPREHENSIVE METABOLIC PANEL
ALT: 13 U/L (ref 0–44)
AST: 15 U/L (ref 15–41)
Albumin: 3.9 g/dL (ref 3.5–5.0)
Alkaline Phosphatase: 45 U/L (ref 38–126)
Anion gap: 7 (ref 5–15)
BUN: 10 mg/dL (ref 6–20)
CO2: 23 mmol/L (ref 22–32)
Calcium: 8.6 mg/dL — ABNORMAL LOW (ref 8.9–10.3)
Chloride: 107 mmol/L (ref 98–111)
Creatinine, Ser: 0.76 mg/dL (ref 0.44–1.00)
GFR, Estimated: >60 mL/min (ref >60)
Glucose, Bld: 98 mg/dL (ref 70–99)
Potassium: 3.9 mmol/L (ref 3.5–5.1)
Sodium: 137 mmol/L (ref 135–145)
Total Bilirubin: 0.7 mg/dL (ref 0.0–1.2)
Total Protein: 7.4 g/dL (ref 6.5–8.1)

## 2023-09-26 LAB — TROPONIN I (HIGH SENSITIVITY): Troponin I (High Sensitivity): <2 ng/L (ref <18)

## 2023-09-26 LAB — POC URINE PREG, ED: Preg Test, Ur: NEGATIVE

## 2023-09-26 NOTE — ED Triage Notes (Signed)
First nurse note: sent from cardiology for HTN to be admitted. NAD noted

## 2023-09-26 NOTE — ED Triage Notes (Signed)
Pt in via POV, reports being sent over from Cardiology due to hypertension.  Patient reports this has been an ongoing issue since September, prescribed meds but patient states they give her a headache so she has not been taking them routinely.    Patient is asymptomatic, denies any complaints at this time.  Ambulatory to triage, NAD noted at this time.

## 2023-09-26 NOTE — ED Provider Notes (Signed)
[facility] Provider Note    Event Date/Time   First MD Initiated Contact with Patient 09/26/23 1127     (approximate)   History   Hypertension   HPI  Carly Rodriguez is a 38 y.o. female with a history of high blood pressure presents for evaluation of elevated blood pressure.  Review of records demonstrates the patient has had chronically elevated blood pressure with systolics around 09/26/1928.  She is supposed to take labetalol but admits that she takes it infrequently because she thinks it gives her headache.  She was sent in from cardiology for evaluation of her blood pressure.     Physical Exam   Triage Vital Signs: ED Triage Vitals  Encounter Vitals Group     BP 09/26/23 1023 (!) 191/127     Systolic BP Percentile --      Diastolic BP Percentile --      Pulse Rate 09/26/23 1023 85     Resp 09/26/23 1023 15     Temp 09/26/23 1023 98.4 F (36.9 C)     Temp Source 09/26/23 1023 Oral     SpO2 09/26/23 1023 97 %     Weight 09/26/23 1025 66.7 kg (147 lb)     Height 09/26/23 1025 1.499 m (4\' 11" )     Head Circumference --      Peak Flow --      Pain Score 09/26/23 1023 0     Pain Loc --      Pain Education --      Exclude from Growth Chart --     Most recent vital signs: Vitals:   09/26/23 1023 09/26/23 1217  BP: (!) 191/127 (!) 189/116  Pulse: 85 89  Resp: 15 18  Temp: 98.4 F (36.9 C)   SpO2: 97% 98%     General: Awake, no distress.  CV:  Good peripheral perfusion.  Resp:  Normal effort.  Abd:  No distention.  Other:     ED Results / Procedures / Treatments   Labs (all labs ordered are listed, but only abnormal results are displayed) Labs Reviewed  CBC - Abnormal; Notable for the following components:      Result Value   Platelets 412 (*)    All other components within normal limits  COMPREHENSIVE METABOLIC PANEL - Abnormal; Notable for the following components:   Calcium 8.6 (*)    All other components within  normal limits  POC URINE PREG, ED  TROPONIN I (HIGH SENSITIVITY)     EKG     RADIOLOGY     PROCEDURES:  Critical Care performed:  Procedures   MEDICATIONS ORDERED IN ED: Medications - No data to display   IMPRESSION / MDM / ASSESSMENT AND PLAN / ED COURSE  I reviewed the triage vital signs and the nursing notes. Patient's presentation is most consistent with exacerbation of chronic illness.  Patient presents with elevated blood pressure as detailed above, she is asymptomatic.  Review of records demonstrates this is a chronic issue, she does not compliant with her labetalol  Lab work reviewed and is reassuring, pregnancy test negative  Strongly encouraged her to to take her labetalol as prescribed and follow-up closely with cardiology for blood pressure control, no indication for admission at this time        FINAL CLINICAL IMPRESSION(S) / ED DIAGNOSES   Final diagnoses:  Primary hypertension     Rx / DC Orders   ED Discharge Orders  None        Note:  This document was prepared using Dragon voice recognition software and may include unintentional dictation errors.   Jene Every, MD 09/26/23 1359

## 2023-09-26 NOTE — ED Provider Triage Note (Signed)
Emergency Medicine Provider Triage Evaluation Note  SEVERA SCHMAL , a 38 y.o. female  was evaluated in triage.  Pt complains of htn, sent by [facility] for admission, although patient admits to not being consistent with her medication, has note from MD at [facility] with instructions.  Review of Systems  Positive:  Negative:   Physical Exam  BP (!) 191/127 (BP Location: Left Arm)   Pulse 85   Temp 98.4 F (36.9 C) (Oral)   Resp 15   Ht 4\' 11"  (1.499 m)   Wt 66.7 kg   LMP 09/15/2023 (Exact Date)   SpO2 97%   BMI 29.69 kg/m  Gen:   Awake, no distress   Resp:  Normal effort  MSK:   Moves extremities without difficulty  Other:    Medical Decision Making  Medically screening exam initiated at 10:35 AM.  Appropriate orders placed.  KIRSTIE QUIRAM was informed that the remainder of the evaluation will be completed by another provider, this initial triage assessment does not replace that evaluation, and the importance of remaining in the ED until their evaluation is complete.     Faythe Ghee, PA-C 09/26/23 1036

## 2023-09-27 ENCOUNTER — Other Ambulatory Visit: Payer: Self-pay | Admitting: Internal Medicine

## 2023-09-27 DIAGNOSIS — R519 Headache, unspecified: Secondary | ICD-10-CM

## 2023-09-27 DIAGNOSIS — I10 Essential (primary) hypertension: Secondary | ICD-10-CM
